# Patient Record
Sex: Male | Born: 1995 | Race: Black or African American | Hispanic: No | Marital: Single | Smoking: Current every day smoker
Health system: Southern US, Academic
[De-identification: ages and names within clinical notes are randomized; demographics above are authoritative.]

## PROBLEM LIST (undated history)

## (undated) DIAGNOSIS — S42009A Fracture of unspecified part of unspecified clavicle, initial encounter for closed fracture: Secondary | ICD-10-CM

## (undated) DIAGNOSIS — Z9109 Other allergy status, other than to drugs and biological substances: Secondary | ICD-10-CM

---
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
## 2014-09-21 ENCOUNTER — Encounter (HOSPITAL_COMMUNITY): Payer: Self-pay | Admitting: *Deleted

## 2014-09-21 ENCOUNTER — Emergency Department (HOSPITAL_COMMUNITY)
Admission: EM | Admit: 2014-09-21 | Discharge: 2014-09-21 | Discharge status: Against Medical Advice | Payer: Self-pay | Attending: Emergency Medicine | Admitting: Emergency Medicine

## 2014-09-21 ENCOUNTER — Other Ambulatory Visit (HOSPITAL_COMMUNITY)
Admission: RE | Admit: 2014-09-21 | Discharge: 2014-09-21 | Disposition: A | Discharge status: Home / Self Care | Payer: Self-pay | Source: Ambulatory Visit | Attending: Family Medicine | Admitting: Family Medicine

## 2014-09-21 ENCOUNTER — Encounter (HOSPITAL_COMMUNITY): Payer: Self-pay | Admitting: Family Medicine

## 2014-09-21 ENCOUNTER — Emergency Department (HOSPITAL_COMMUNITY)
Admission: EM | Admit: 2014-09-21 | Discharge: 2014-09-21 | Disposition: A | Discharge status: Home / Self Care | Payer: Self-pay | Attending: Emergency Medicine | Admitting: Emergency Medicine

## 2014-09-21 ENCOUNTER — Emergency Department (HOSPITAL_COMMUNITY): Payer: Self-pay

## 2014-09-21 ENCOUNTER — Emergency Department (INDEPENDENT_AMBULATORY_CARE_PROVIDER_SITE_OTHER)
Admission: EM | Admit: 2014-09-21 | Discharge: 2014-09-21 | Disposition: A | Discharge status: Nursing Home (Includes ICF) | Payer: Self-pay | Source: Home / Self Care | Attending: Family Medicine | Admitting: Family Medicine

## 2014-09-21 DIAGNOSIS — Z720 Tobacco use: Secondary | ICD-10-CM | POA: Insufficient documentation

## 2014-09-21 DIAGNOSIS — I861 Scrotal varices: Secondary | ICD-10-CM | POA: Insufficient documentation

## 2014-09-21 DIAGNOSIS — N50812 Left testicular pain: Secondary | ICD-10-CM

## 2014-09-21 DIAGNOSIS — R1909 Other intra-abdominal and pelvic swelling, mass and lump: Secondary | ICD-10-CM | POA: Insufficient documentation

## 2014-09-21 DIAGNOSIS — Z8781 Personal history of (healed) traumatic fracture: Secondary | ICD-10-CM | POA: Insufficient documentation

## 2014-09-21 DIAGNOSIS — Z113 Encounter for screening for infections with a predominantly sexual mode of transmission: Secondary | ICD-10-CM | POA: Insufficient documentation

## 2014-09-21 DIAGNOSIS — N508 Other specified disorders of male genital organs: Secondary | ICD-10-CM

## 2014-09-21 HISTORY — DX: Other allergy status, other than to drugs and biological substances: Z91.09

## 2014-09-21 HISTORY — DX: Fracture of unspecified part of unspecified clavicle, initial encounter for closed fracture: S42.009A

## 2014-09-21 LAB — URINALYSIS, ROUTINE W REFLEX MICROSCOPIC
Bilirubin Urine: NEGATIVE
Glucose, UA: NEGATIVE mg/dL
HGB URINE DIPSTICK: NEGATIVE
Ketones, ur: 15 mg/dL — AB
LEUKOCYTES UA: NEGATIVE
NITRITE: NEGATIVE
PROTEIN: NEGATIVE mg/dL
Specific Gravity, Urine: 1.026 (ref 1.005–1.030)
UROBILINOGEN UA: 1 mg/dL (ref 0.0–1.0)
pH: 5.5 (ref 5.0–8.0)

## 2014-09-21 NOTE — ED Provider Notes (Signed)
CSN: [id number]     Arrival date & time 09/21/14  1500 History   First MD Initiated Contact with Patient 09/21/14 1517     Chief Complaint  Patient presents with  . Testicle Pain   (Consider location/radiation/quality/duration/timing/severity/associated sxs/prior Treatment) HPI Comments: Progressive and persistent left testicular pain without known injury. Denies penile discharge, dysuria, testicular swelling, hematuria, abdominal pain, genital lesions. Is sexually active. No previous events.  Reports himself to be otherwise healthy. PCP: none   Patient is a 19 y.o. male presenting with testicular pain. The history is provided by the patient.  Testicle Pain This is a new problem. The current episode started yesterday (noticed yesterday evening when getting into a car). The problem occurs constantly. The problem has been gradually worsening.    Past Medical History  Diagnosis Date  . Pollen allergies   . Fracture, clavicle     Left   History reviewed. No pertinent past surgical history. History reviewed. No pertinent family history. History  Substance Use Topics  . Smoking status: Current Every Day Smoker -- 1.00 packs/day    Types: Cigarettes  . Smokeless tobacco: Not on file  . Alcohol Use: Yes     Comment: occasional    Review of Systems  Genitourinary: Positive for testicular pain.  All other systems reviewed and are negative.   Allergies  Review of patient's allergies indicates no known allergies.  Home Medications   Prior to Admission medications   Not on File   BP 79/62 mmHg  Pulse 50  Temp(Src) 98.3 F (36.8 C) (Oral)  Resp 16  SpO2 100% Physical Exam  Constitutional: He is oriented to person, place, and time. He appears well-developed and well-nourished. No distress.  HENT:  Head: Normocephalic and atraumatic.  Eyes: Conjunctivae are normal.  Cardiovascular: Normal rate, regular rhythm and normal heart sounds.   Abdominal: Soft. Bowel sounds are  normal. He exhibits no distension. There is no tenderness. Hernia confirmed negative in the right inguinal area and confirmed negative in the left inguinal area.  Genitourinary: Penis normal. Right testis shows no mass, no swelling and no tenderness. Right testis is descended. Left testis shows tenderness. Left testis shows no mass and no swelling. Left testis is descended. Circumcised.  Tenderness to palpation both in region of left epididymis and at lower pole of left testicle No penile discharge or genital lesions  Musculoskeletal: Normal range of motion.  Lymphadenopathy:       Right: No inguinal adenopathy present.       Left: No inguinal adenopathy present.  Neurological: He is alert and oriented to person, place, and time.  Skin: Skin is warm and dry. No rash noted. No erythema.  Psychiatric: He has a normal mood and affect. His behavior is normal.  Nursing note and vitals reviewed.   ED Course  Procedures (including critical care time) Labs Review Labs Reviewed  URINE CULTURE  URINE CYTOLOGY ANCILLARY ONLY    Imaging Review No results found.   MDM   1. Testicular pain, left   urine cytology pending Suspect epididymitis-orchitis. Patient expresses great concern regarding possibility of testicular torsion after reading about the condition on the Internet. Requests investigation for torsion. [facility] and possible testicular imaging.     Ria ClockJennifer Lee H Milee Qualls, [place] 09/21/14 [phone]

## 2014-09-21 NOTE — ED Notes (Signed)
C/o pain in L testicle onset last night after sitting down in the car @ 2100.  Pain is constant aching but worse at times with movement or touching" it goes to a 10."

## 2014-09-21 NOTE — ED Notes (Signed)
Pt here for left testicle pain since last night. Sent from [facility] for [place]

## 2014-09-21 NOTE — ED Provider Notes (Signed)
CSN: [id number]     Arrival date & time 09/21/14  1640 History   First MD Initiated Contact with Patient 09/21/14 1749     Chief Complaint  Patient presents with  . Groin Swelling     (Consider location/radiation/quality/duration/timing/severity/associated sxs/prior Treatment) The history is provided by the patient.  Benjamin Ryan is a 19 y.o. male who presenting with left testicular pain. Left-sided testicular pain since last night. It is constant and woke up still in pain. Denies any fevers or chills or urinary symptoms. Sexually active with girlfriend only but does not use protection. Denies dysuria or penile discharge. Went to urgent care and was diagnosed with possible epididymitis but sent here for ultrasound to rule out torsion.    Past Medical History  Diagnosis Date  . Pollen allergies   . Fracture, clavicle     Left   History reviewed. No pertinent past surgical history. No family history on file. History  Substance Use Topics  . Smoking status: Current Every Day Smoker -- 1.00 packs/day    Types: Cigarettes  . Smokeless tobacco: Not on file  . Alcohol Use: Yes     Comment: occasional    Review of Systems  Genitourinary: Positive for testicular pain.  All other systems reviewed and are negative.     Allergies  Review of patient's allergies indicates no known allergies.  Home Medications   Prior to Admission medications   Not on File   BP 100/55 mmHg  Pulse 62  Temp(Src) 98.1 F (36.7 C) (Oral)  Resp 16  Ht 5\' 9"  (1.753 m)  Wt 133 lb (60.328 kg)  BMI 19.63 kg/m2  SpO2 100% Physical Exam  Constitutional: He is oriented to person, place, and time. He appears well-developed and well-nourished.  HENT:  Head: Normocephalic.  Mouth/Throat: Oropharynx is clear and moist.  Eyes: Conjunctivae are normal. Pupils are equal, round, and reactive to light.  Neck: Normal range of motion. Neck supple.  Cardiovascular: Normal rate, regular rhythm and normal heart  sounds.   Pulmonary/Chest: Effort normal and breath sounds normal. No respiratory distress. He has no wheezes. He has no rales.  Abdominal: Soft. Bowel sounds are normal. He exhibits no distension. There is no tenderness. There is no rebound.  Genitourinary:  Tenderness L epididymus. Good cremasteric reflex. No obvious hernia   Musculoskeletal: Normal range of motion. He exhibits no edema or tenderness.  Neurological: He is alert and oriented to person, place, and time.  Skin: Skin is warm and dry.  Psychiatric: He has a normal mood and affect. His behavior is normal. Judgment and thought content normal.  Nursing note and vitals reviewed.   ED Course  Procedures (including critical care time) Labs Review Labs Reviewed  URINALYSIS, ROUTINE W REFLEX MICROSCOPIC - Abnormal; Notable for the following:    Color, Urine AMBER (*)    Ketones, ur 15 (*)    All other components within normal limits    Imaging Review [place] Scrotum  09/21/2014   CLINICAL DATA:  Left-sided testicular pain.  EXAM: SCROTAL ULTRASOUND  DOPPLER ULTRASOUND OF THE TESTICLES  TECHNIQUE: Complete ultrasound examination of the testicles, epididymis, and other scrotal structures was performed. Color and spectral Doppler ultrasound were also utilized to evaluate blood flow to the testicles.  COMPARISON:  None.  FINDINGS: Right testicle  Measurements: 4.7 x 2.4 x 2.7 cm, within normal limits. No mass or microlithiasis visualized.  Left testicle  Measurements: 4.4 x 2.2 x 2.8 cm, within normal limits. No mass  or microlithiasis visualized.  Right epididymis:  Normal in size and appearance.  Left epididymis:  Normal in size and appearance.  Hydrocele:  None visualized.  Varicocele: A left-sided varicocele is present. The veins measure up to 2.4 mm and dilate the somewhat with Valsalva. There dilated veins within the area of tenderness.  Pulsed Doppler interrogation of both testes demonstrates normal low resistance arterial and venous  waveforms bilaterally.  IMPRESSION: 1. Left-sided varicocele. This corresponds to the location of the patient's pain. The varicocele is exaggerated with Valsalva maneuvers. 2. Normal appearance of the testicles bilaterally. 3. Normal color Doppler signal and pulse Doppler waveforms.   Electronically Signed   By: Marin Roberts M.D.   On: 09/21/2014 19:51   [place] Art/ven Flow Abd Pelv Doppler  09/21/2014   CLINICAL DATA:  Left-sided testicular pain.  EXAM: SCROTAL ULTRASOUND  DOPPLER ULTRASOUND OF THE TESTICLES  TECHNIQUE: Complete ultrasound examination of the testicles, epididymis, and other scrotal structures was performed. Color and spectral Doppler ultrasound were also utilized to evaluate blood flow to the testicles.  COMPARISON:  None.  FINDINGS: Right testicle  Measurements: 4.7 x 2.4 x 2.7 cm, within normal limits. No mass or microlithiasis visualized.  Left testicle  Measurements: 4.4 x 2.2 x 2.8 cm, within normal limits. No mass or microlithiasis visualized.  Right epididymis:  Normal in size and appearance.  Left epididymis:  Normal in size and appearance.  Hydrocele:  None visualized.  Varicocele: A left-sided varicocele is present. The veins measure up to 2.4 mm and dilate the somewhat with Valsalva. There dilated veins within the area of tenderness.  Pulsed Doppler interrogation of both testes demonstrates normal low resistance arterial and venous waveforms bilaterally.  IMPRESSION: 1. Left-sided varicocele. This corresponds to the location of the patient's pain. The varicocele is exaggerated with Valsalva maneuvers. 2. Normal appearance of the testicles bilaterally. 3. Normal color Doppler signal and pulse Doppler waveforms.   Electronically Signed   By: Marin Roberts M.D.   On: 09/21/2014 19:51     EKG Interpretation None      MDM   Final diagnoses:  Testicular pain, left    Benjamin Ryan is a 19 y.o. male here with L testicular pain. Likely epididymitis. Will get [place] to  further assess. Refused STD testing and doesn't want empiric treatment.    8:13 PM US showed no torsion or epididymitis. Just L sided varicocele. Recommend NSAIDs, rest, urology f/u.     Richardean Canal, MD 09/21/14 2017

## 2014-09-21 NOTE — Discharge Instructions (Signed)
Take motrin 600 mg every 6 hrs for pain.   Try and get some rest.   Stay off your feet.   Follow up with urology.   Return to ER if you have severe pain, worse testicular swelling.

## 2014-09-21 NOTE — ED Notes (Signed)
[facility]. The pt has had pain in his testicle since last night.  No previous history.  No urinary problems

## 2014-09-22 LAB — URINE CULTURE
Colony Count: NO GROWTH
Culture: NO GROWTH
Special Requests: NORMAL

## 2014-09-24 LAB — URINE CYTOLOGY ANCILLARY ONLY
CHLAMYDIA, DNA PROBE: NEGATIVE
Neisseria Gonorrhea: NEGATIVE
TRICH (WINDOWPATH): NEGATIVE

## 2015-01-03 ENCOUNTER — Emergency Department (HOSPITAL_COMMUNITY)
Admission: EM | Admit: 2015-01-03 | Discharge: 2015-01-03 | Disposition: A | Discharge status: Home / Self Care | Payer: Self-pay | Attending: Emergency Medicine | Admitting: Emergency Medicine

## 2015-01-03 ENCOUNTER — Encounter (HOSPITAL_COMMUNITY): Payer: Self-pay | Admitting: *Deleted

## 2015-01-03 DIAGNOSIS — I861 Scrotal varices: Secondary | ICD-10-CM | POA: Insufficient documentation

## 2015-01-03 DIAGNOSIS — Z8781 Personal history of (healed) traumatic fracture: Secondary | ICD-10-CM | POA: Insufficient documentation

## 2015-01-03 DIAGNOSIS — Z720 Tobacco use: Secondary | ICD-10-CM | POA: Insufficient documentation

## 2015-01-03 NOTE — ED Notes (Signed)
Pt presents via POV c/o left groin pain beginning x 2 days ago.  Pt states he has had this pain before and it was a "varice vein".  Pt also states he has a lump on his testicle.  Pt a x 4, NAD. MD at bedside

## 2015-01-03 NOTE — ED Provider Notes (Signed)
CSN: [id number]     Arrival date & time 01/03/15  0102 History   First MD Initiated Contact with Patient 01/03/15 [phone]     Chief Complaint  Patient presents with  . Groin Pain     (Consider location/radiation/quality/duration/timing/severity/associated sxs/prior Treatment) HPI  Benjamin Ryan is a 19 y.o. male who is concerned about a "vein" on his testicle. It has been tender and swollen for the last 2 days. He was previously diagnosed with a varicocele, left, 4 months ago. He did not follow-up with urology after that. He denies fever, chills, nausea, vomiting, dysuria, hematuria, or testicular pain. There are no other known modifying factors.   Past Medical History  Diagnosis Date  . Pollen allergies   . Fracture, clavicle     Left   History reviewed. No pertinent past surgical history. No family history on file. History  Substance Use Topics  . Smoking status: Current Every Day Smoker -- 1.00 packs/day    Types: Cigarettes  . Smokeless tobacco: Not on file  . Alcohol Use: Yes     Comment: occasional    Review of Systems  All other systems reviewed and are negative.     Allergies  Review of patient's allergies indicates no known allergies.  Home Medications   Prior to Admission medications   Not on File   BP 115/57 mmHg  Pulse 80  Temp(Src) 98.3 F (36.8 C) (Oral)  Resp 18  Ht  (1.753 m)  Wt 140 lb (63.504 kg)  BMI 20.67 kg/m2  SpO2 100% Physical Exam  Constitutional: He is oriented to person, place, and time. He appears well-developed and well-nourished.  HENT:  Head: Normocephalic and atraumatic.  Right Ear: External ear normal.  Left Ear: External ear normal.  Eyes: Conjunctivae and EOM are normal. Pupils are equal, round, and reactive to light.  Neck: Normal range of motion and phonation normal. Neck supple.  Cardiovascular: Normal rate.   Pulmonary/Chest: Effort normal. He exhibits no bony tenderness.  Genitourinary:  Normal penis and scrotum.  Testes normal bilaterally. Very minor tenderness of the left spermatic cord region, without palpable mass, deformity, or significant tenderness.  Musculoskeletal: Normal range of motion.  Neurological: He is alert and oriented to person, place, and time. No cranial nerve deficit or sensory deficit. He exhibits normal muscle tone. Coordination normal.  Skin: Skin is warm, dry and intact.  Psychiatric: He has a normal mood and affect. His behavior is normal. Judgment and thought content normal.  Nursing note and vitals reviewed.   ED Course  Procedures (including critical care time)  Findings discussed with the patient, all questions answered.  Labs Review Labs Reviewed - No data to display  Imaging Review No results found.   EKG Interpretation None      MDM   Final diagnoses:  Varicocele    Prior varicocele, possibly recurrent, but very small. Doubt STD, UTI, testicular process or scrotal infection.  Nursing Notes Reviewed/ Care Coordinated Applicable Imaging Reviewed Interpretation of Laboratory Data incorporated into ED treatment  The patient appears reasonably screened and/or stabilized for discharge and I doubt any other medical condition or other [facility] requiring further screening, evaluation, or treatment in the ED at this time prior to discharge.  Plan: Home Medications- over-the-counter analgesia; Home Treatments- rest; return here if the recommended treatment, does not improve the symptoms; Recommended follow up- follow up with urology as needed for problems.     Mancel Bale, MD 01/03/15 1041

## 2015-01-03 NOTE — Discharge Instructions (Signed)
Varicocele A varicocele is a swelling of veins in the scrotum (the bag of skin that contains the testicles). It is most common in young men. It occurs most often on the left side. Small or painless varicoceles do not need treatment. Most often, this is not a serious problem, but further tests may be needed to confirm the diagnosis. Surgery may be needed if complications of varicoceles arise. Rarely, varicoceles can reoccur after surgery. CAUSES  The swelling is due to blood backing up in the vein that leads from the testicle back to the body. Blood backs up because the valves inside the vein are not working properly. Veins normally return blood to the heart. Valves in veins are supposed to be one-way valves. They should not allow blood to flow backwards. If the valves do not work well, blood can pool in a vein and make it swell. The same thing happens with varicose veins in the leg. SYMPTOMS  A varicocele most often causes no symptoms. When they occur, symptoms include:   Swelling on one side of the scrotum.  Swelling that is more obvious when standing up.  A lumpy feeling in the scrotum.  Heaviness on one side of the scrotum.  Dull ache in the scrotum, especially after exercise or prolonged standing or sitting.  Slower growth or reduced size of the testicle on the side of the varicocele (in young males).  Problems with fertility can arise if the testicle does not grow normally. DIAGNOSIS  Varicocele is usually diagnosed by a physical exam. Sometimes ultrasonography is done. TREATMENT  Usually, varicoceles need no treatment. They are often routinely monitored on exam by your caregiver to ensure they do not slow the growth of the testicle on that side. Treatment may be needed if:  The varicocele is large.  There is a lot of pain.  The varicocele causes a decrease in the size of the testicle in a growing adolescent.  The other testicle is absent or not normal.  Varicoceles are found on  both sides of the scrotum.  There is pain when exercising.  There are fertility problems. There are two types of treatment:  Surgery. The surgeon ties off the swollen veins. Surgery may be done with an incision in the skin or through a laparoscope. The surgery is usually done in an outpatient setting. Outpatient means there is no overnight stay in a hospital.  Embolization. A small tube is placed in a vein and guided into the swollen veins. X-rays are used to guide the small tube. Tiny metal coils or other blocking items are put through the tube. This blocks swollen veins and the flow of blood. This is usually done in an outpatient setting without the use of general anesthesia. HOME CARE INSTRUCTIONS  To decrease discomfort:  Wear supportive underwear.  Use an athletic supporter for sports.  Only take over-the-counter or prescription medicines for pain or discomfort as directed by your caregiver. SEEK MEDICAL CARE IF:   Pain is increasing.  Swelling does not decrease when lying down.  Testicle is smaller.  The testicle becomes enlarged, swollen, red, or painful. Document Released: 09/06/2000 Document Revised: 08/23/2011 Document Reviewed: 09/10/2009 ExitCare Patient Information 2015 [employer]. This information is not intended to replace advice given to you by your health care provider. Make sure you discuss any questions you have with your health care provider.  

## 2015-08-06 ENCOUNTER — Emergency Department (HOSPITAL_COMMUNITY)
Admission: EM | Admit: 2015-08-06 | Discharge: 2015-08-06 | Disposition: A | Discharge status: Home / Self Care | Payer: Self-pay | Attending: Emergency Medicine | Admitting: Emergency Medicine

## 2015-08-06 ENCOUNTER — Encounter (HOSPITAL_COMMUNITY): Payer: Self-pay

## 2015-08-06 DIAGNOSIS — T7840XA Allergy, unspecified, initial encounter: Secondary | ICD-10-CM | POA: Insufficient documentation

## 2015-08-06 DIAGNOSIS — X58XXXA Exposure to other specified factors, initial encounter: Secondary | ICD-10-CM | POA: Insufficient documentation

## 2015-08-06 DIAGNOSIS — Z8781 Personal history of (healed) traumatic fracture: Secondary | ICD-10-CM | POA: Insufficient documentation

## 2015-08-06 DIAGNOSIS — F17210 Nicotine dependence, cigarettes, uncomplicated: Secondary | ICD-10-CM | POA: Insufficient documentation

## 2015-08-06 DIAGNOSIS — Y9389 Activity, other specified: Secondary | ICD-10-CM | POA: Insufficient documentation

## 2015-08-06 DIAGNOSIS — Y9289 Other specified places as the place of occurrence of the external cause: Secondary | ICD-10-CM | POA: Insufficient documentation

## 2015-08-06 DIAGNOSIS — Y998 Other external cause status: Secondary | ICD-10-CM | POA: Insufficient documentation

## 2015-08-06 MED ORDER — DIPHENHYDRAMINE HCL 25 MG PO CAPS
25.0000 mg | ORAL_CAPSULE | Freq: Once | ORAL | Status: AC
  Administered 2015-08-06: 25 mg via ORAL
  Filled 2015-08-06: qty 1

## 2015-08-06 NOTE — ED Provider Notes (Signed)
CSN: [id number]     Arrival date & time 08/06/15  1234 History  By signing my name below, I, Placido Sou, attest that this documentation has been prepared under the direction and in the presence of [facility], PA-C. Electronically Signed: Placido Sou, ED Scribe. 08/06/2015. 1:45 PM.      Chief Complaint  Patient presents with  . Pruritis  . Rash   The history is provided by the patient. No language interpreter was used.    HPI Comments: Benjamin Ryan is a 20 y.o. male who presents to the Emergency Department complaining of a diffuse erythematous itching rash with associated generalized swelling across his torso and extremities onset 4 hours ago which has now improved. 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 He denies a hx of similar symptoms or having worked with insulation in the past. He denies SOB, oral swelling or any other associated symptoms at this time. No medications taken PTA.    Past Medical History  Diagnosis Date  . Pollen allergies   . Fracture, clavicle     Left   History reviewed. No pertinent past surgical history. History reviewed. No pertinent family history. Social History  Substance Use Topics  . Smoking status: Current Every Day Smoker -- 1.00 packs/day    Types: Cigarettes  . Smokeless tobacco: None  . Alcohol Use: Yes     Comment: occasional    Review of Systems  HENT: Negative for facial swelling.   Respiratory: Negative for shortness of breath.   Skin: Positive for color change and rash.    Allergies  Review of patient's allergies indicates no known allergies.  Home Medications   Prior to Admission medications   Not on File   BP 109/80 mmHg  Pulse 102  Temp(Src) 98.1 F (36.7 C) (Oral)  Resp 16  SpO2 98%    Physical Exam  Constitutional: He is oriented to person, place, and time. He appears well-developed and well-nourished.  HENT:  Head: Normocephalic and  atraumatic.  Airway patent; oropharynx clear  Eyes: EOM are normal.  Neck: Normal range of motion.  Cardiovascular: Normal rate, regular rhythm and normal heart sounds.  Exam reveals no gallop and no friction rub.   No murmur heard. Pulmonary/Chest: Effort normal and breath sounds normal. No respiratory distress. He has no wheezes. He has no rales. He exhibits no tenderness.  Abdominal: Soft.  Musculoskeletal: Normal range of motion.  Neurological: He is alert and oriented to person, place, and time.  Skin: Skin is warm and dry.  No erythema, swelling or rash noted  Psychiatric: He has a normal mood and affect.  Nursing note and vitals reviewed.   ED Course  Procedures  DIAGNOSTIC STUDIES: Oxygen Saturation is 98% on RA, normal by my interpretation.    COORDINATION OF CARE: 1:44 PM Discussed next steps with the pt. He verbalized understanding and is agreeable with the plan.   Labs Review Labs Reviewed - No data to display  Imaging Review No results found.   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 Patient noted associated swelling but at time of my evaluation, rash and swelling had resolved. Airway patent. Lungs clear. Recommended benadryl/pepcid as needed for itching, swelling. Given complete resolution after removing self from  offending agent, steroid unlikely to help but offered prednisone burst - patient declined. Return precautions, follow up care, and home care instructions given. Patient informed to avoid insulation in the future as this is likely the offending agent.   I personally performed the services described in this documentation, which was scribed in my presence. The recorded information has been reviewed and is accurate.  [facility] Nnaemeka Samson, PA-C 08/06/15 1402  Nelva Nay, MD 08/06/15 [phone]

## 2015-08-06 NOTE — ED Notes (Signed)
Pt c/o bilateral arm, chest, and bilateral leg itching and rash.  Denies pain.  Denies SOB and oral swelling.  Pt reports that he started a new job today and he works w/ [employer]."

## 2015-08-06 NOTE — Discharge Instructions (Signed)
Take Benadryl and pepcid as needed for itching or rash (both found over the counter at any pharmacy or large retail store). Avoid irritants.  Return to ER for shortness of breath, new or worsening symptoms, any additional concerns.

## 2015-09-01 ENCOUNTER — Encounter (HOSPITAL_COMMUNITY): Payer: Self-pay | Admitting: Emergency Medicine

## 2015-09-01 ENCOUNTER — Emergency Department (HOSPITAL_COMMUNITY)
Admission: EM | Admit: 2015-09-01 | Discharge: 2015-09-01 | Disposition: A | Discharge status: Home / Self Care | Payer: Self-pay | Attending: Emergency Medicine | Admitting: Emergency Medicine

## 2015-09-01 DIAGNOSIS — R69 Illness, unspecified: Secondary | ICD-10-CM

## 2015-09-01 DIAGNOSIS — J111 Influenza due to unidentified influenza virus with other respiratory manifestations: Secondary | ICD-10-CM | POA: Insufficient documentation

## 2015-09-01 DIAGNOSIS — F17210 Nicotine dependence, cigarettes, uncomplicated: Secondary | ICD-10-CM | POA: Insufficient documentation

## 2015-09-01 LAB — RAPID STREP SCREEN (MED CTR MEBANE ONLY): STREPTOCOCCUS, GROUP A SCREEN (DIRECT): NEGATIVE

## 2015-09-01 MED ORDER — ACETAMINOPHEN 325 MG PO TABS
ORAL_TABLET | ORAL | Status: AC
  Filled 2015-09-01: qty 2

## 2015-09-01 MED ORDER — ACETAMINOPHEN 325 MG PO TABS
650.0000 mg | ORAL_TABLET | Freq: Once | ORAL | Status: AC | PRN
  Administered 2015-09-01: 650 mg via ORAL

## 2015-09-01 NOTE — ED Provider Notes (Signed)
CSN: [id number]     Arrival date & time 09/01/15  0810 History   First MD Initiated Contact with Patient 09/01/15 0827     Chief Complaint  Patient presents with  . Headache  . Sore Throat     (Consider location/radiation/quality/duration/timing/severity/associated sxs/prior Treatment) Patient is a 20 y.o. male presenting with headaches, pharyngitis, and general illness. The history is provided by the patient.  Headache Associated symptoms: congestion, cough, fever and myalgias   Associated symptoms: no abdominal pain, no diarrhea, no nausea and no vomiting   Sore Throat Associated symptoms include headaches. Pertinent negatives include no chest pain, no abdominal pain and no shortness of breath.  Illness Severity:  Moderate Onset quality:  Sudden Duration:  4 days Timing:  Constant Progression:  Unchanged Chronicity:  New Associated symptoms: congestion, cough, fever, headaches and myalgias   Associated symptoms: no abdominal pain, no chest pain, no diarrhea, no nausea, no rash, no shortness of breath and no vomiting    20 yo M with a URI. Been going on for about 5 days. Cough congestion fevers chills myalgias. Denies any comorbidities. Has had a sick contact in his grandparents.  Past Medical History  Diagnosis Date  . Pollen allergies   . Fracture, clavicle     Left   History reviewed. No pertinent past surgical history. No family history on file. Social History  Substance Use Topics  . Smoking status: Current Every Day Smoker -- 1.00 packs/day    Types: Cigarettes  . Smokeless tobacco: None  . Alcohol Use: Yes     Comment: occasional    Review of Systems  Constitutional: Positive for fever. Negative for chills.  HENT: Positive for congestion. Negative for facial swelling.   Eyes: Negative for discharge and visual disturbance.  Respiratory: Positive for cough. Negative for shortness of breath.   Cardiovascular: Negative for chest pain and palpitations.   Gastrointestinal: Negative for nausea, vomiting, abdominal pain and diarrhea.  Musculoskeletal: Positive for myalgias. Negative for arthralgias.  Skin: Negative for color change and rash.  Neurological: Positive for headaches. Negative for tremors and syncope.  Psychiatric/Behavioral: Negative for confusion and dysphoric mood.      Allergies  Review of patient's allergies indicates no known allergies.  Home Medications   Prior to Admission medications   Not on File   BP 120/72 mmHg  Pulse 80  Temp(Src) 101.7 F (38.7 C) (Oral)  Resp 18  Ht 5\' 9"  (1.753 m)  Wt 143 lb 1 oz (64.893 kg)  BMI 21.12 kg/m2  SpO2 100% Physical Exam  Constitutional: He is oriented to person, place, and time. He appears well-developed and well-nourished.  HENT:  Head: Normocephalic and atraumatic.  Swollen turbinates, posterior nasal drip, no noted sinus ttp, tm normal bilaterally.    Eyes: EOM are normal. Pupils are equal, round, and reactive to light.  Neck: Normal range of motion. Neck supple. No JVD present.  Cardiovascular: Normal rate and regular rhythm.  Exam reveals no gallop and no friction rub.   No murmur heard. Pulmonary/Chest: No respiratory distress. He has no wheezes.  Abdominal: He exhibits no distension. There is no tenderness. There is no rebound and no guarding.  Musculoskeletal: Normal range of motion.  Neurological: He is alert and oriented to person, place, and time.  Skin: No rash noted. No pallor.  Psychiatric: He has a normal mood and affect. His behavior is normal.  Nursing note and vitals reviewed.   ED Course  Procedures (including critical care time) Labs  Review Labs Reviewed  RAPID STREP SCREEN (NOT AT [facility])  CULTURE, GROUP A STREP [facility])    Imaging Review No results found. I have personally reviewed and evaluated these images and lab results as part of my medical decision-making.   EKG Interpretation None      MDM   Final diagnoses:   Influenza-like illness    20 y.o. male presents with cough, rhinorrhea, fever for 5 days. Patient appears well. No signs of toxicity. No hypoxia, tachypnea or other signs of respiratory distress. No signs of clinical dehydration. Doubt PNA, and no evidence of any other illness. Discussed symptomatic treatment with the parents and they will follow closely with their PCP.   9:33 AM:  I have discussed the diagnosis/risks/treatment options with the patient and family and believe the pt to be eligible for discharge home to follow-up with PCP. We also discussed returning to the ED immediately if new or worsening sx occur. We discussed the sx which are most concerning (e.g., sudden worsening pain, fever, inability to tolerate by mouth) that necessitate immediate return. Medications administered to the patient during their visit and any new prescriptions provided to the patient are listed below.  Medications given during this visit Medications  acetaminophen (TYLENOL) 325 MG tablet (not administered)  acetaminophen (TYLENOL) tablet 650 mg (650 mg Oral Given 09/01/15 0819)    New Prescriptions   No medications on file    The patient appears reasonably screen and/or stabilized for discharge and I doubt any other medical condition or other [facility] requiring further screening, evaluation, or treatment in the ED at this time prior to discharge.       Melene Plan, DO 09/01/15 [phone]

## 2015-09-01 NOTE — ED Notes (Signed)
Pt c/o headache, sore throat and nasal congestion onset Thursday. Pt reports that he did have mucous in his chest but that has resolved. Pt tried thera flu, robitussin, sinus medication, alka seltzer and nyquil.

## 2015-09-01 NOTE — Discharge Instructions (Signed)
Take tylenol 2 pills 4 times a day and motrin 4 pills 3 times a day.  Drink plenty of fluids.  Return for worsening shortness of breath, headache, confusion. Follow up with your family doctor.  ° °Influenza, Adult °Influenza ("the flu") is a viral infection of the respiratory tract. It occurs more often in winter months because people spend more time in close contact with one another. Influenza can make you feel very sick. Influenza easily spreads from person to person (contagious). °CAUSES  °Influenza is caused by a virus that infects the respiratory tract. You can catch the virus by breathing in droplets from an infected person's cough or sneeze. You can also catch the virus by touching something that was recently contaminated with the virus and then touching your mouth, nose, or eyes. °RISKS AND COMPLICATIONS °You may be at risk for a more severe case of influenza if you smoke cigarettes, have diabetes, have chronic heart disease (such as heart failure) or lung disease (such as asthma), or if you have a weakened immune system. Elderly people and pregnant women are also at risk for more serious infections. The most common problem of influenza is a lung infection (pneumonia). Sometimes, this problem can require emergency medical care and may be life threatening. °SIGNS AND SYMPTOMS  °Symptoms typically last 4 to 10 days and may include: °· Fever. °· Chills. °· Headache, body aches, and muscle aches. °· Sore throat. °· Chest discomfort and cough. °· Poor appetite. °· Weakness or feeling tired. °· Dizziness. °· Nausea or vomiting. °DIAGNOSIS  °Diagnosis of influenza is often made based on your history and a physical exam. A nose or throat swab test can be done to confirm the diagnosis. °TREATMENT  °In mild cases, influenza goes away on its own. Treatment is directed at relieving symptoms. For more severe cases, your health care provider may prescribe antiviral medicines to shorten the sickness. Antibiotic medicines  are not effective because the infection is caused by a virus, not by bacteria. °HOME CARE INSTRUCTIONS °· Take medicines only as directed by your health care provider. °· Use a cool mist humidifier to make breathing easier. °· Get plenty of rest until your temperature returns to normal. This usually takes 3 to 4 days. °· Drink enough fluid to keep your urine clear or pale yellow. °· Cover your mouth and nose when coughing or sneezing, and wash your hands well to prevent the virus from spreading. °· Stay home from work or school until the fever is gone for at least 1 full day. °PREVENTION  °An annual influenza vaccination (flu shot) is the best way to avoid getting influenza. An annual flu shot is now routinely recommended for all adults in the [place] °SEEK MEDICAL CARE IF: °· You experience chest pain, your cough worsens, or you produce more mucus. °· You have nausea, vomiting, or diarrhea. °· Your fever returns or gets worse. °SEEK IMMEDIATE MEDICAL CARE IF: °· You have trouble breathing, you become short of breath, or your skin or nails become bluish. °· You have severe pain or stiffness in the neck. °· You develop a sudden headache, or pain in the face or ear. °· You have nausea or vomiting that you cannot control. °MAKE SURE YOU:  °· Understand these instructions. °· Will watch your condition. °· Will get help right away if you are not doing well or get worse. °  °This information is not intended to replace advice given to you by your health care provider. Make sure you discuss any questions you have with your health care provider. °  °Document Released: 05/28/2000 Document Revised: 06/21/2014 Document Reviewed: 08/30/2011 °Elsevier Interactive Patient Education ©  2016 Elsevier Inc. ° °

## 2015-09-03 LAB — CULTURE, GROUP A STREP (THRC)

## 2015-10-10 ENCOUNTER — Encounter (HOSPITAL_COMMUNITY): Payer: Self-pay | Admitting: Emergency Medicine

## 2015-10-10 ENCOUNTER — Emergency Department (HOSPITAL_COMMUNITY): Payer: Worker's Compensation

## 2015-10-10 ENCOUNTER — Emergency Department (HOSPITAL_COMMUNITY)
Admission: EM | Admit: 2015-10-10 | Discharge: 2015-10-10 | Disposition: A | Discharge status: Home / Self Care | Payer: Worker's Compensation | Attending: Emergency Medicine | Admitting: Emergency Medicine

## 2015-10-10 DIAGNOSIS — Y939 Activity, unspecified: Secondary | ICD-10-CM | POA: Insufficient documentation

## 2015-10-10 DIAGNOSIS — Y998 Other external cause status: Secondary | ICD-10-CM | POA: Insufficient documentation

## 2015-10-10 DIAGNOSIS — S83005A Unspecified dislocation of left patella, initial encounter: Secondary | ICD-10-CM | POA: Insufficient documentation

## 2015-10-10 DIAGNOSIS — X501XXA Overexertion from prolonged static or awkward postures, initial encounter: Secondary | ICD-10-CM | POA: Insufficient documentation

## 2015-10-10 DIAGNOSIS — Y9269 Other specified industrial and construction area as the place of occurrence of the external cause: Secondary | ICD-10-CM | POA: Insufficient documentation

## 2015-10-10 DIAGNOSIS — Z791 Long term (current) use of non-steroidal anti-inflammatories (NSAID): Secondary | ICD-10-CM | POA: Insufficient documentation

## 2015-10-10 DIAGNOSIS — S8992XA Unspecified injury of left lower leg, initial encounter: Secondary | ICD-10-CM | POA: Diagnosis present

## 2015-10-10 DIAGNOSIS — Z79899 Other long term (current) drug therapy: Secondary | ICD-10-CM | POA: Diagnosis not present

## 2015-10-10 DIAGNOSIS — F17210 Nicotine dependence, cigarettes, uncomplicated: Secondary | ICD-10-CM | POA: Diagnosis not present

## 2015-10-10 DIAGNOSIS — Z79891 Long term (current) use of opiate analgesic: Secondary | ICD-10-CM | POA: Diagnosis not present

## 2015-10-10 MED ORDER — HYDROMORPHONE HCL 1 MG/ML IJ SOLN
1.0000 mg | Freq: Once | INTRAMUSCULAR | Status: AC
  Administered 2015-10-10: 1 mg via INTRAVENOUS
  Filled 2015-10-10: qty 1

## 2015-10-10 MED ORDER — CYCLOBENZAPRINE HCL 10 MG PO TABS: 5.0000 mg | ORAL_TABLET | Freq: Two times a day (BID) | ORAL | Status: AC | PRN

## 2015-10-10 MED ORDER — IBUPROFEN 800 MG PO TABS: 800.0000 mg | ORAL_TABLET | Freq: Three times a day (TID) | ORAL | Status: AC

## 2015-10-10 MED ORDER — KETOROLAC TROMETHAMINE 30 MG/ML IJ SOLN
30.0000 mg | Freq: Once | INTRAMUSCULAR | Status: AC
  Administered 2015-10-10: 30 mg via INTRAVENOUS
  Filled 2015-10-10: qty 1

## 2015-10-10 MED ORDER — HYDROCODONE-ACETAMINOPHEN 5-325 MG PO TABS: 2.0000 | ORAL_TABLET | ORAL | Status: AC | PRN

## 2015-10-10 NOTE — ED Notes (Signed)
Per EMS pt was at work and bent then felt left knee pop with swelling and deformity noted. 20GA to left AC established and 250mg  Fetanyl given prior to arrival at 0205. Left knee splinted in position of comfort.

## 2015-10-10 NOTE — ED Notes (Signed)
Pt reports understanding of discharge information. No questions at time of discharge 

## 2015-10-10 NOTE — Discharge Instructions (Signed)
Patellar Dislocation A patellar dislocation occurs when your kneecap (patella) slips out of its normal position in a groove in front of the lower end of your thighbone (femur). This groove is called the patellofemoral groove.  CAUSES The kneecap is normally positioned over the front of the knee joint at the base of the thighbone. A kneecap can be dislocated when:  The kneecap is out of place (patellar tracking disorder), and force is applied.  The foot is firmly planted pointing outward, and the knee bends with the thigh turned inward. This kind of injury is common during many sports activities.  The inner edge of the kneecap is hit, pushing it toward the outer side of the leg. SIGNS AND SYMPTOMS  Severe pain.  A misshapen knee that looks like a bone is out of position.  A popping sensation, followed by a feeling that something is out of place.  Inability to bend or straighten the knee.  Knee swelling.  Cool, pale skin or numbness and tingling in or below the affected knee. DIAGNOSIS  Your health care provider will physically examine the injured area. An X-ray exam may be done to make sure a bone fracture has not occurred. In some cases, your health care provider may look inside your knee joint with an instrument much like a pencil-sized telescope (arthroscope). This may be done to make sure you have no loose cartilage in your joint. Loose cartilage is not visible on an X-ray image. TREATMENT  In many instances, the patella can be guided back into position without much difficulty. It often goes back into position by straightening the leg. Often, nothing more may be needed other than a brief period of immobilization followed by the exercises your health care provider recommends. If patellar dislocation starts to become frequent after the first incident, surgery may be needed to prevent your patella from slipping out of place. HOME CARE INSTRUCTIONS   Only take over-the-counter or  prescription medicines for pain, discomfort, or fever as directed by your health care provider.  Use a knee brace if directed to do so by your health care provider.  Use crutches as instructed.  Apply ice to the injured knee:  Put ice in a plastic bag.  Place a towel between your skin and the bag.  Leave the ice on for 20 minutes, 2-3 times a day.  Follow your health care provider's instructions for doing any recommended range-of-motion exercises or other exercises. SEEK IMMEDIATE MEDICAL CARE IF:  You have increased pain or swelling in the knee that is not relieved with medicine.  You have increasing inflammation in the knee.  You have locking or catching of your knee. MAKE SURE YOU:  Understand these instructions.  Will watch your condition.  Will get help right away if you are not doing well or get worse.   This information is not intended to replace advice given to you by your health care provider. Make sure you discuss any questions you have with your health care provider.   Document Released: 02/23/2001 Document Revised: 03/21/2013 Document Reviewed: 01/10/2013 Elsevier Interactive Patient Education 2016 Elsevier Inc. Knee Immobilizer A knee immobilizer is used to support and protect an injured or painful knee. Knee immobilizers keep your knee from being used while it is healing. Some of the common immobilizers used include splints (air, plaster, fiberglass, stiff cloth, or aluminum) or casts. Wear your knee immobilizer as instructed and only remove it as instructed. HOME CARE INSTRUCTIONS   Use absorbent powder (such  as baby powder or talcum powder) to control irritation from sweat and friction.  Adjust the immobilizer to be firm but not tight. Signs of an immobilizer that is too tight include:  Swelling.  Numbness.  Color change in your foot or ankle.  Increased pain.  While resting, raise your leg above the level of your heart. Pillows can be used for  support. This reduces throbbing and helps healing.  Remove the immobilizer to bathe and sleep. SEEK MEDICAL CARE IF:   You have increasing pain or swelling in the knee, foot, or ankle.  You have problems caused by the knee immobilizer, or it breaks or needs replacement. MAKE SURE YOU:   Understand these instructions.  Will watch your condition.  Will get help right away if you are not doing well or get worse.   This information is not intended to replace advice given to you by your health care provider. Make sure you discuss any questions you have with your health care provider.   Document Released: 05/31/2005 Document Revised: 06/21/2014 Document Reviewed: 01/22/2013 Elsevier Interactive Patient Education [employer] Else[employer] Inc.

## 2015-10-10 NOTE — ED Notes (Signed)
Bed: WA17 Expected date:  Expected time:  Means of arrival:  Comments: Knee injury

## 2015-10-10 NOTE — ED Notes (Signed)
Provider at bedside to reduce left patella after administration of Dilaudid.

## 2015-10-10 NOTE — ED Notes (Signed)
Patient transported to X-ray 

## 2015-10-10 NOTE — ED Provider Notes (Signed)
CSN: [id number]     Arrival date & time 10/10/15  0214 History   First MD Initiated Contact with Patient 10/10/15 0230     Chief Complaint  Patient presents with  . Knee Injury     (Consider location/radiation/quality/duration/timing/severity/associated sxs/prior Treatment) HPI  She has a past medical history of pollen allergies in the left clavicle fracture. He was brought in by EMS. He was at work when he was twisting towards his left knee when he felt a pop and had extreme pain. EMS notes a deformity on arrival. He was given 200 g of fentanyl en route. They splinted his left knee and position for comfort. He denies hitting his head, loss of consciousness, neck injury. He denies falling on the knee or anything hitting the knee. Denies hx of the same  Past Medical History  Diagnosis Date  . Pollen allergies   . Fracture, clavicle     Left   No past surgical history on file. History reviewed. No pertinent family history. Social History  Substance Use Topics  . Smoking status: Current Every Day Smoker -- 1.00 packs/day    Types: Cigarettes  . Smokeless tobacco: None  . Alcohol Use: Yes     Comment: occasional    Review of Systems  Review of Systems All other systems negative except as documented in the HPI. All pertinent positives and negatives as reviewed in the HPI.   Allergies  Review of patient's allergies indicates no known allergies.  Home Medications   Prior to Admission medications   Medication Sig Start Date End Date Taking? Authorizing Provider  cyclobenzaprine (FLEXERIL) 10 MG tablet Take 0.5-1 tablets (5-10 mg total) by mouth 2 (two) times daily as needed for muscle spasms. 10/10/15   Marlon Pel, PA-C  HYDROcodone-acetaminophen (NORCO/VICODIN) 5-325 MG tablet Take 2 tablets by mouth every 4 (four) hours as needed. 10/10/15   Kaitlyn Skowron Neva Seat, PA-C  ibuprofen (ADVIL,MOTRIN) 800 MG tablet Take 1 tablet (800 mg total) by mouth 3 (three) times daily. 10/10/15    Jolly Carlini Neva Seat, PA-C   BP 112/76 mmHg  Pulse 56  Temp(Src) 97.6 F (36.4 C) (Oral)  Resp 18  Ht  (1.778 m)  Wt 65.772 kg  BMI 20.81 kg/m2  SpO2 98% Physical Exam  Constitutional: He appears well-developed and well-nourished. No distress.  HENT:  Head: Normocephalic and atraumatic.  Eyes: Pupils are equal, round, and reactive to light.  Neck: Normal range of motion. Neck supple.  Cardiovascular: Normal rate and regular rhythm.   Pulmonary/Chest: Effort normal.  Abdominal: Soft.  Musculoskeletal:       Left knee: He exhibits decreased range of motion and abnormal patellar mobility. Tenderness found.  Knee in 90 flexion, lateral dislocation of patella.  Neurological: He is alert.  Skin: Skin is warm and dry.  Nursing note and vitals reviewed.   ED Course  Reduction of dislocation Date/Time: 10/10/2015 3:01 AM Performed by: Marlon Pel Authorized by: Marlon Pel Consent: Verbal consent obtained. Risks and benefits: risks, benefits and alternatives were discussed Consent given by: patient Patient understanding: patient states understanding of the procedure being performed Patient identity confirmed: verbally with patient and arm band Time out: Immediately prior to procedure a "time out" was called to verify the correct patient, procedure, equipment, support staff and site/side marked as required. Local anesthesia used: no Patient sedated: no Patient tolerance: Patient tolerated the procedure well with no immediate complications Comments: Successful relocation of patella   (including critical care time) Labs Review Labs Reviewed -  No data to display  Imaging Review Dg Knee Complete 4 Views Left  10/10/2015  CLINICAL DATA:  Status post reduction of left patellar dislocation. Initial encounter. EXAM: LEFT KNEE - COMPLETE 4+ VIEW COMPARISON:  None. FINDINGS: The patella is appropriately located this time. There is no evidence of fracture or dislocation. The  joint spaces are preserved. No significant degenerative change is seen; the patellofemoral joint is grossly unremarkable in appearance. No significant joint effusion is seen. The visualized soft tissues are normal in appearance. IMPRESSION: No evidence of fracture or dislocation. Electronically Signed   By: Roanna RaiderJeffery  Chang M.D.   On: 10/10/2015 03:12   I have personally reviewed and evaluated these images and lab results as part of my medical decision-making.   EKG Interpretation None      MDM   Final diagnoses:  Dislocation of patella, left, initial encounter    After the patella was reduced the patient then had significantly improved range of motion and significant improvement of pain to the left knee. X-rays obtained in no obvious sign of fracture with successful reduction of the patella. Patient placed in a knee immobilizer and given crutches. Will give prescription for muscle relaxers, anti-inflammatories and pain medication and referred or so.  Medications  HYDROmorphone (DILAUDID) injection 1 mg (1 mg Intravenous Given 10/10/15 0248)  ketorolac (TORADOL) 30 MG/ML injection 30 mg (30 mg Intravenous Given 10/10/15 0316)    I discussed results, diagnoses and plan with Johny DrillingShi A Corado. They voice there understanding and questions were answered. We discussed follow-up recommendations and return precautions.     Marlon Peliffany Preslea Rhodus, PA-C 10/10/15 [phone]  Derwood KaplanAnkit Nanavati, MD 10/10/15 [phone]

## 2015-10-10 NOTE — ED Notes (Signed)
Left knee was reduced without difficulty. PT currently awaiting xray of post reduction.

## 2016-07-19 IMAGING — CR DG KNEE COMPLETE 4+V*L*
4 series · 4 of 4 positions shown · non-contrast
Comparison: None.

CLINICAL DATA: Status post reduction of left patellar dislocation.
Initial encounter.

EXAM:
LEFT KNEE - COMPLETE 4+ VIEW

[x knee ap left]
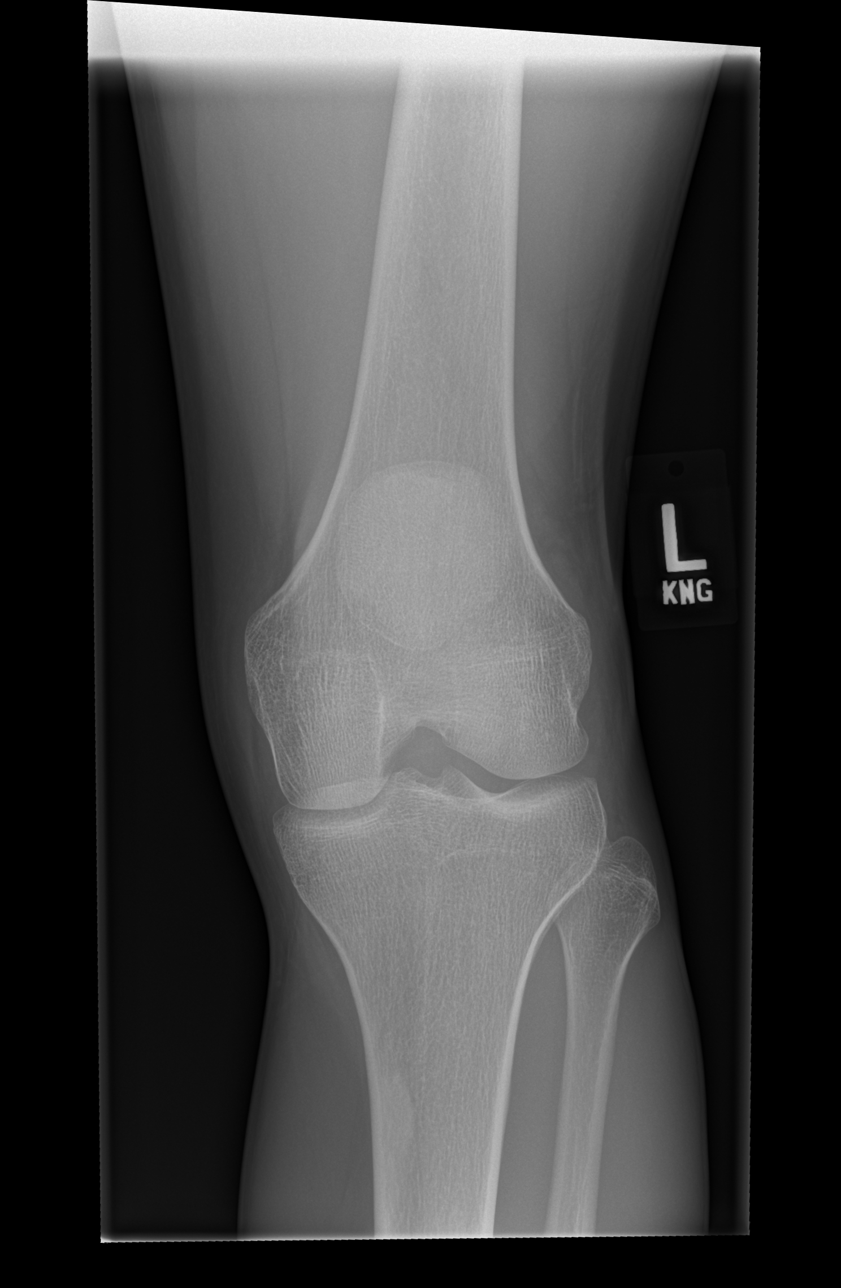

[x knee obl left (1 of 2)]
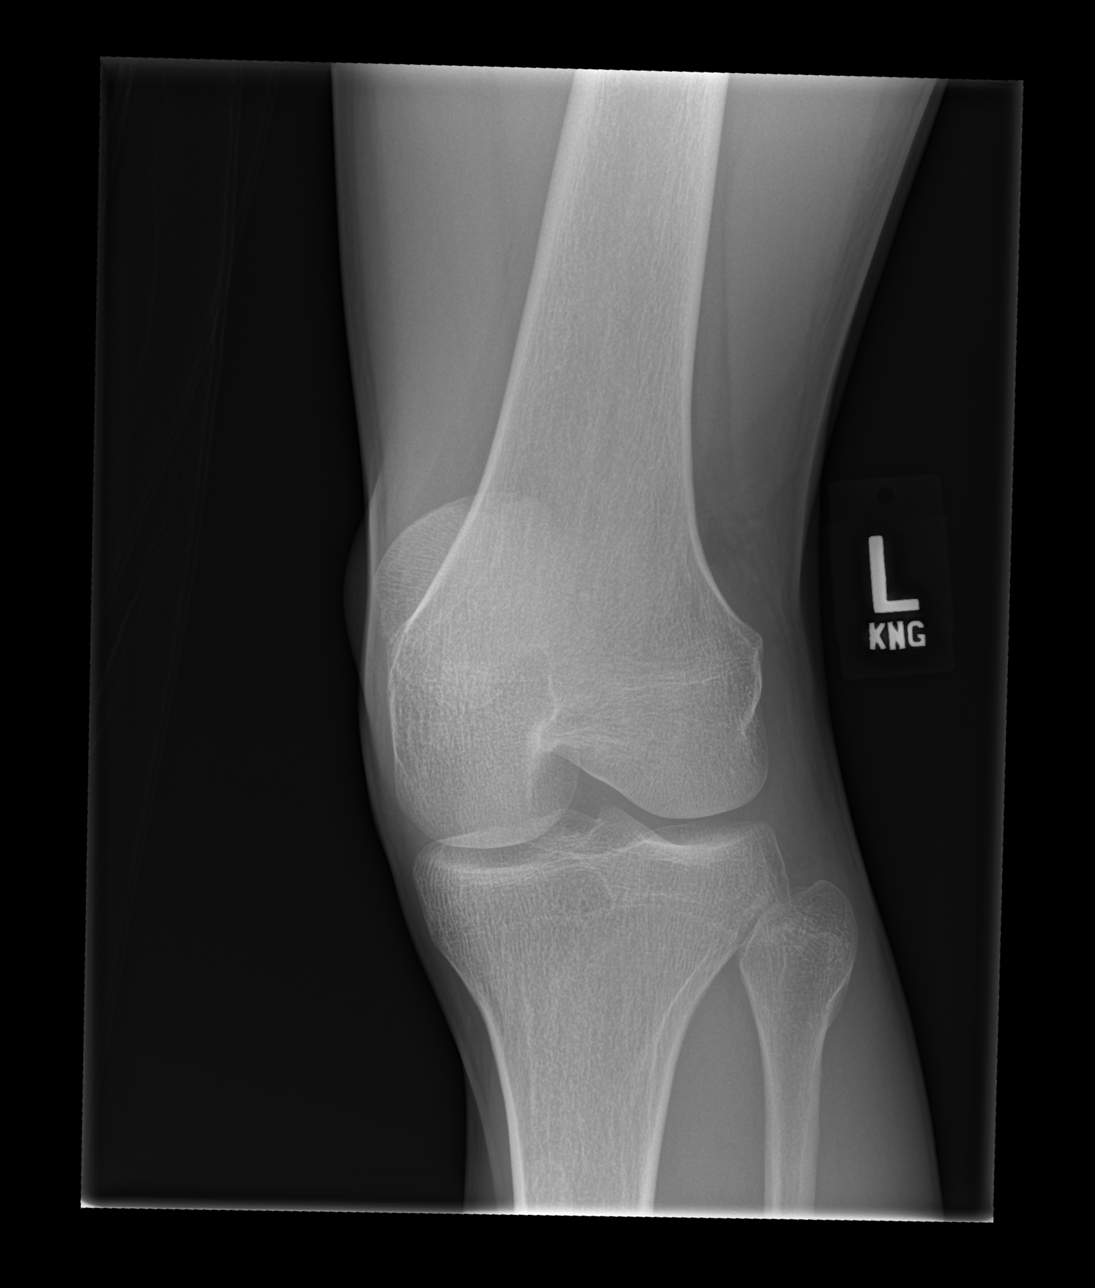

[x knee obl left (2 of 2)]
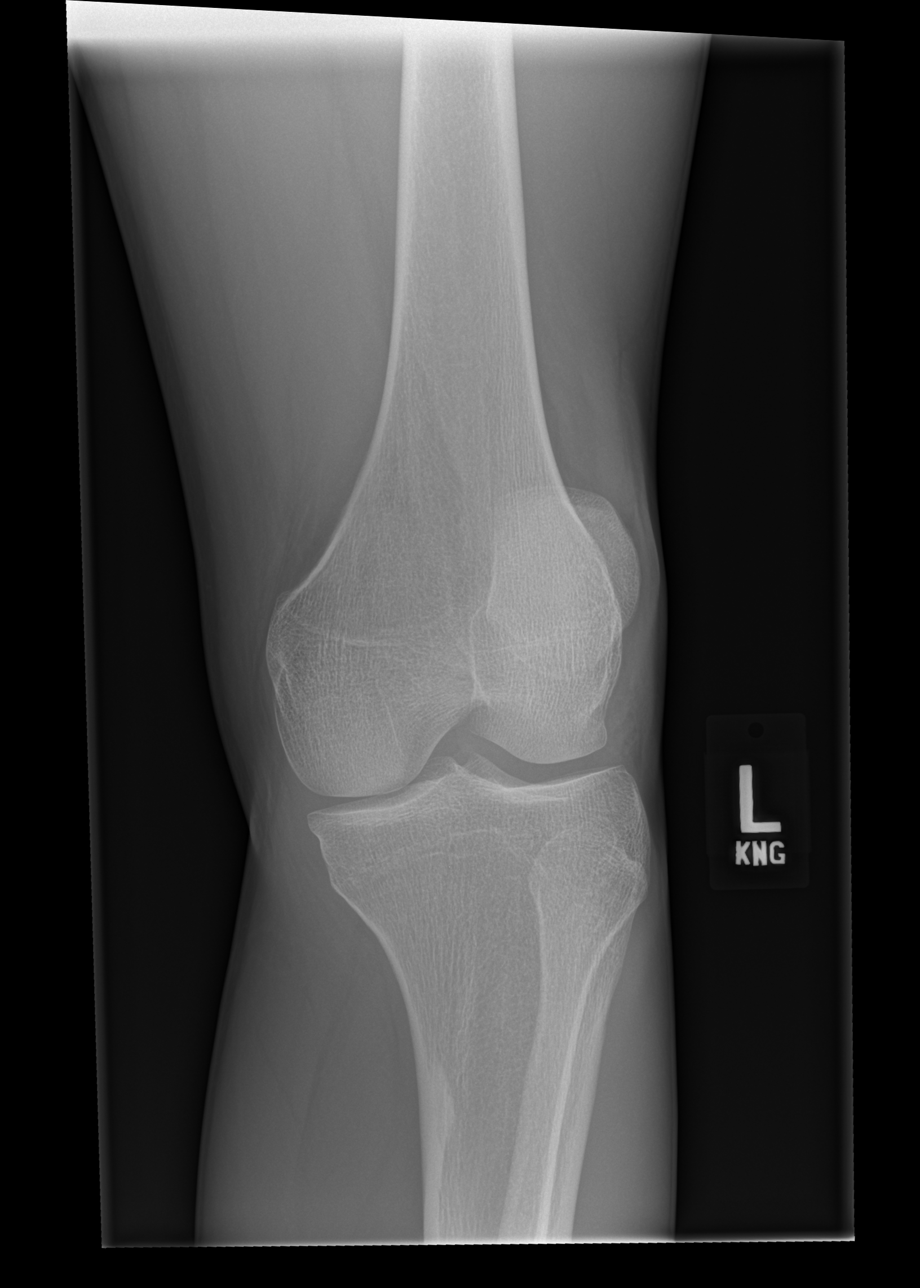

[x knee lat left]
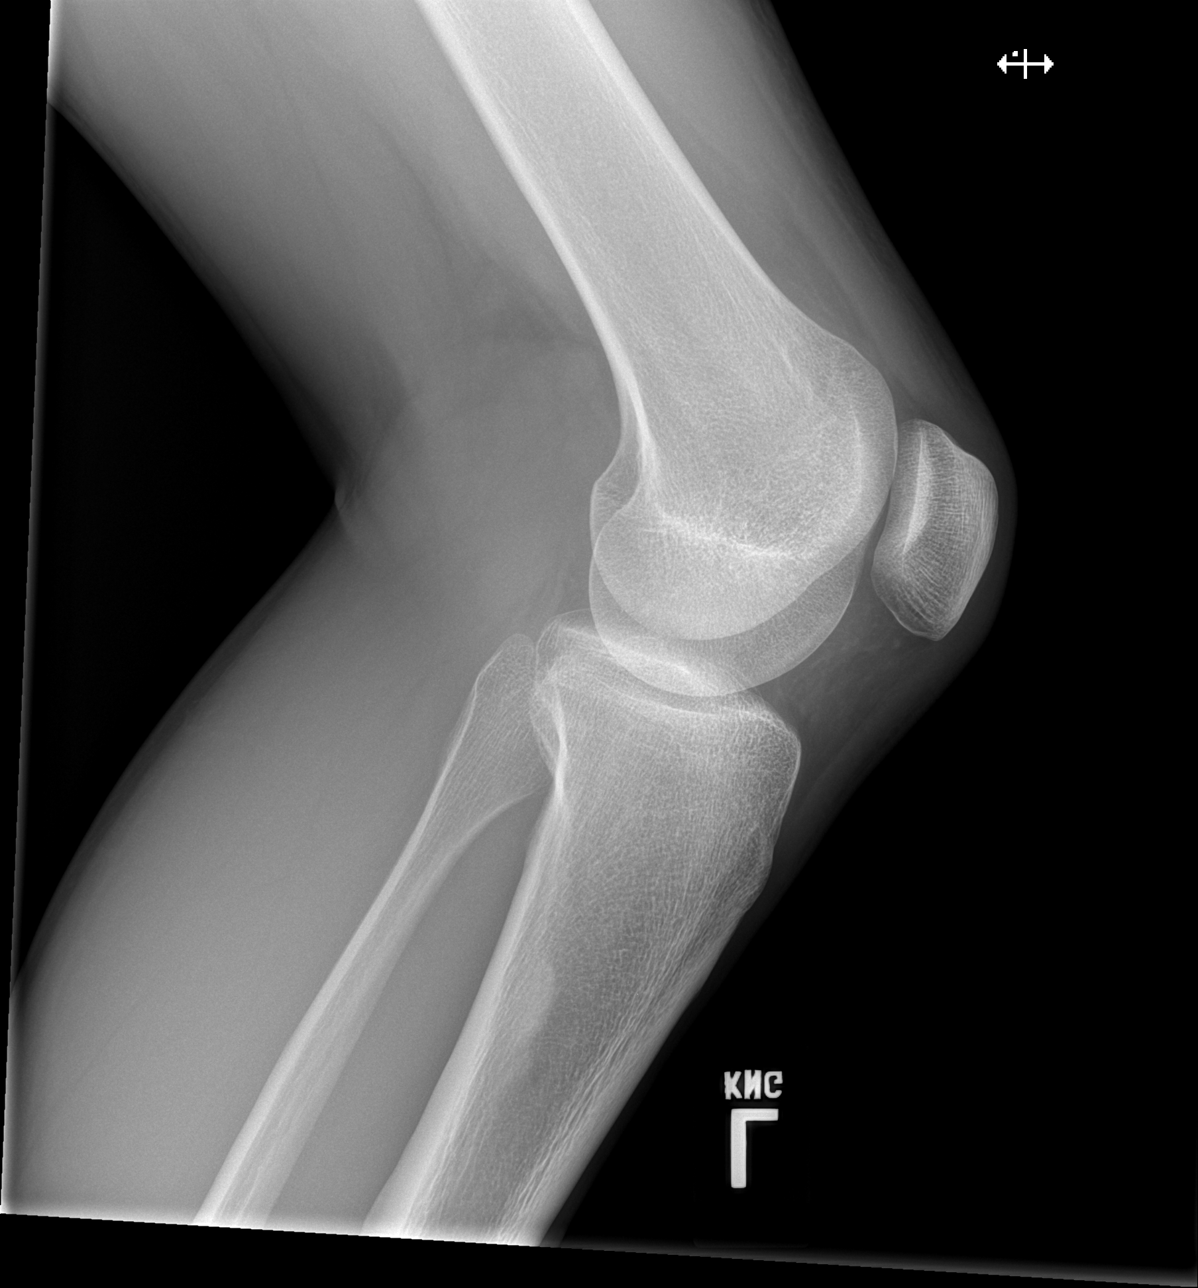

[4 of 4 positions shown; findings below may reference images not displayed]

FINDINGS: The patella is appropriately located this time. There is no evidence
of fracture or dislocation. The joint spaces are preserved. No
significant degenerative change is seen; the patellofemoral joint is
grossly unremarkable in appearance.

No significant joint effusion is seen. The visualized soft tissues
are normal in appearance.
IMPRESSION: No evidence of fracture or dislocation.

## 2017-11-04 ENCOUNTER — Emergency Department (HOSPITAL_COMMUNITY): Payer: Self-pay

## 2017-11-04 ENCOUNTER — Emergency Department
Admission: EM | Admit: 2017-11-04 | Discharge: 2017-11-04 | Disposition: A | Discharge status: Home / Self Care | Payer: Self-pay | Attending: Emergency Medicine | Admitting: Emergency Medicine

## 2017-11-04 ENCOUNTER — Encounter (HOSPITAL_COMMUNITY): Payer: Self-pay

## 2017-11-04 DIAGNOSIS — S0502XA Injury of conjunctiva and corneal abrasion without foreign body, left eye, initial encounter: Secondary | ICD-10-CM

## 2017-11-04 DIAGNOSIS — S0501XA Injury of conjunctiva and corneal abrasion without foreign body, right eye, initial encounter: Secondary | ICD-10-CM

## 2017-11-04 DIAGNOSIS — F17210 Nicotine dependence, cigarettes, uncomplicated: Secondary | ICD-10-CM | POA: Insufficient documentation

## 2017-11-04 DIAGNOSIS — S0500XA Injury of conjunctiva and corneal abrasion without foreign body, unspecified eye, initial encounter: Secondary | ICD-10-CM | POA: Insufficient documentation

## 2017-11-04 MED ORDER — TETRACAINE HCL (PF) 0.5 % EYE DROPS
1.00 [drp] | OPHTHALMIC | Status: DC
Start: 2017-11-04 — End: 2017-11-04
  Filled 2017-11-04: qty 80

## 2017-11-04 MED ORDER — ERYTHROMYCIN 5 MG/GRAM (0.5 %) EYE OINTMENT
TOPICAL_OINTMENT | Freq: Three times a day (TID) | OPHTHALMIC | 0 refills | Status: AC
Start: 2017-11-04 — End: ?

## 2017-11-04 MED ORDER — FLUORESCEIN 1 MG EYE STRIPS
2.00 | ORAL_STRIP | OPHTHALMIC | Status: DC
Start: 2017-11-04 — End: 2017-11-04
  Filled 2017-11-04: qty 2

## 2017-11-04 MED ORDER — ACETAMINOPHEN 300 MG-CODEINE 30 MG TABLET
1.00 | ORAL_TABLET | Freq: Four times a day (QID) | ORAL | 0 refills | Status: AC | PRN
Start: 2017-11-04 — End: ?

## 2017-11-04 NOTE — ED Triage Notes (Signed)
C.o pain in bilateral eyes and watering of eyes. Stated feels like needles behind his eyes. Not sure if he got something in eyes from work.

## 2017-11-04 NOTE — ED Provider Notes (Signed)
Emergency Department  Provider Note  HPI - 11/04/2017      Name: Roger Ortega  Age and Gender: 22 y.o. male  Attending: Dr. Lorita Officer  APP: Kirke Shaggy PA-C    History provided by: Patient    HPI:  Roger Ortega is a 22 y.o. male  who presents to the ED today for bilateral eye pain. 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 After arriving home, he began to experience bilateral eye pain and watering. He notes that it feels like needles poking his eyes. His current discomfort is rated as 9/10. Denies all other symptoms/complaints at this time. Patient is unsure if he could have gotten something in his eyes while at work. Tetanus is UTD (received at age 38). No reported past medical/surgical hx. NKDA. Smokes 1 ppd.      PCP: None    Location: BL eyes  Quality: Pain   Onset: Today  Severity: 9/10  Timing: Still present  Context: See HPI  Modifying factors: None noted  Associated symptoms: +BL eye watering    Review of Systems:  Constitutional: No fever, chills, weakness.  Skin: No rashes, lesions.  HENT: No head injury. No sore throat, ear pain, difficulty swallowing.  Eyes: +BL eye pain and watering. No vision changes, redness.  Cardio: No chest pain, palpitations.   Respiratory: No cough, wheezing, SOB.  GI: No abdominal pain. No nausea/vomiting. No diarrhea, constipation.   GU: No dysuria, hematuria, polyuria.  MSK: No joint pain. No neck pain. No back pain.  Neuro: No headache. No loss of sensation, focal deficits, LOC.  All other systems reviewed and are negative, unless commented on in the HPI.      The below information was reviewed with the patient:     Current Medications:  Current Outpatient Medications   Medication Sig   . acetaminophen-codeine (TYLENOL #3) 300-30 mg Oral Tablet Take 1-2 Tabs by mouth Every 6 hours as needed for Other (Corneal abrasions)   . erythromycin (ROMYCIN) 5 mg/gram (0.5 %) Ophthalmic Ointment Instill into both eyes Three times a  day       Allergies:   No Known Allergies    Past Medical History:  History reviewed. No pertinent past medical history.    Past Surgical History:  History reviewed. No pertinent surgical history.    Social History:  Social History     Tobacco Use   . Smoking status: Current Every Day Smoker     Packs/day: 1.00     Types: Cigarettes   . Smokeless tobacco: Never Used   Substance Use Topics   . Alcohol use: Not on file   . Drug use: Not on file     Social History     Substance and Sexual Activity   Drug Use Not on file       Family History:  No family history on file.    Old records were reviewed.    Objective:  Nursing notes were reviewed.    Filed Vitals:    11/04/17 1145   BP: 112/69   Pulse: 54   Resp: 18   Temp: 36.3 C (97.3 F)   SpO2: 100%       Physical Exam:  Nursing note and vitals reviewed.  Vital signs reviewed as above.     Constitutional: Pt is well-developed and well-nourished.   Head: Normocephalic and atraumatic.   Eyes: BL eyes examined with Fluorescein and slit lamp.  Corneal abrasion noted to the right eye at 6 o'clock. Corneal abrasions noted to the left eye at 3 o'clock and 6 o'clock. Pupils are equal, round, and reactive to light. EOM are intact.  Neck: Soft, supple, full range of motion.  Cardiovascular: RRR. No Murmurs/rubs/gallops. Distal pulses present and equal bilaterally.  Pulmonary/Chest: Normal BS BL with no distress. No audible wheezes or crackles are noted.  GI: Abdomen is soft, nontender, nondistended. No rebound, guarding, or masses.  Back: Nontender. Normal range of motion.   Extremities: Normal range of motion. No deformities. Exhibits no edema and no tenderness.   Skin: Warm and dry. No rash or lesions.  Neurological: Alert and oriented X 3. Neurologically intact. No focal deficits noted.  Psychiatric: Patient has a normal mood and affect.       Plan:   Appropriate medications ordered. Medical Records reviewed.    Work-up:  Orders Placed This Encounter   . tetracaine PF  (PONTOCAINE) 0.5 % ophthalmic solution   . fluorescein (FLUOR-I-STRIP) ophthalmic strip   . erythromycin (ROMYCIN) 5 mg/gram (0.5 %) Ophthalmic Ointment   . acetaminophen-codeine (TYLENOL #3) 300-30 mg Oral Tablet        MDM:    Patient rechecked and remained stable throughout the emergency department course. Patient is alert, afebrile, ambulatory and nontoxic.   All questions/concerns addressed, and patient agrees with disposition plan.    Instructed that patient should take prescribed medication(s) as directed.   Advised that patient return to ED immediately for any new or worsening symptoms; otherwise, follow up with PCP.       Impression:   Encounter Diagnosis   Name Primary?   . Corneal abrasion Yes     Disposition:   Discharged     Discussed with patient diagnosis, treatment, and need for follow up.    Medication instructions were discussed with the patient.   It was advised that the patient return to the ED with any new, concerning, or worsening symptoms and follow up as directed.    The patient verbalized understanding of all instructions and had no further questions or concerns.     Follow up:   System, Pcp Not In    Schedule an appointment as soon as possible for a visit       Prescriptions:  Discharge Medication List as of 11/04/2017 12:35 PM      START taking these medications    Details   acetaminophen-codeine (TYLENOL #3) 300-30 mg Oral Tablet Take 1-2 Tabs by mouth Every 6 hours as needed for Other (Corneal abrasions), Disp-12 Tab, R-0, Print      erythromycin (ROMYCIN) 5 mg/gram (0.5 %) Ophthalmic Ointment Both Eyes, 3 TIMES DAILY Starting Fri 11/04/2017, Disp-1 Tube, R-0, Print           Pain Management:  A comprehensive pain management plan for the patient was developed and discussed with the patient. The patient's previous experience with non-opoid and opoid medications was reviewed. The patient's experience with non-pharmacological/alternative therapies for pain management was reviewed. The  patient's history in regard to substance abuse was reviewed.  Information from the Controlled Substance Monitoring Program was accessed ([employer]).  Alternative treatments, non-pharmacological pain management options, were prescribed as a part of the comprehensive the pain management treatment plan. The risks, benefits, and availability of these alternative treatment options were reviewed.  Non-opoid, pharmacological pain management options, were discussed and prescribed/advised, and the risks and benefits of these options were reviewed.  Opoid pain management options were discussed, including  the risks and benefits of these options.  Specifically, the patient was advised that opioids are highly addictive, even when taken as prescribed, that there is a risk of developing a physical or psychological dependence on the controlled substance, and that the risks of taking more opioids than prescribed, or mixing sedatives, benzodiazepines, or alcohol with opioids, can result in fatal respiratory depression.  It was determined that an opoid prescription medication is a necessary and appropriate part of the patient's comprehensive pain management plan.    The patient was advised they would be receiving a prescription for Tylenol #3 (12 tabs) at discharge.  The patient was advised they could fill the prescription for a lesser amount if they chose.  They were advised if they did choose to partially fill the prescription they might not be able to get an addition opoid prescription for several days if they ran out of medication.  The patient and/or their guardian were given an opportunity to ask questions about the comprehensive pain management plan and their questions were answered.      I am scribing for, and in the presence of, Scott Makynzie Dobesh PA-C for services provided on 11/04/2017.  Britney Lipps, SCRIBE   [place], SCRIBE  11/04/2017, 12:45      I personally performed the services described in this documentation, as  scribed  in my presence, and it is both accurate  and complete.  Stormy Card, PA-C  The co-signing faculty was physically present in the emergency department and available for consultation and did not particpate in the care of this patient.  Stormy Card, PA-C  11/10/2017, 13:47

## 2017-11-04 NOTE — Discharge Instructions (Signed)
Please return if worsening.  Please make follow up appointment.

## 2019-05-22 ENCOUNTER — Other Ambulatory Visit: Payer: Self-pay

## 2019-05-22 DIAGNOSIS — Z20822 Contact with and (suspected) exposure to covid-19: Secondary | ICD-10-CM

## 2019-05-24 LAB — NOVEL CORONAVIRUS, NAA: SARS-CoV-2, NAA: NOT DETECTED
